# Patient Record
Sex: Female | Born: 1950 | Race: White | Hispanic: No | Marital: Married | Smoking: Never smoker
Health system: Southern US, Community
[De-identification: ages and names within clinical notes are randomized; demographics above are authoritative.]

---

## 1998-06-10 ENCOUNTER — Inpatient Hospital Stay (HOSPITAL_COMMUNITY): Admission: RE | Admit: 1998-06-10 | Discharge: 1998-06-12 | Payer: Self-pay | Admitting: Obstetrics and Gynecology

## 1999-09-12 ENCOUNTER — Other Ambulatory Visit: Admission: RE | Admit: 1999-09-12 | Discharge: 1999-09-12 | Payer: Self-pay | Admitting: Obstetrics and Gynecology

## 2000-08-06 ENCOUNTER — Encounter: Payer: Self-pay | Admitting: Obstetrics and Gynecology

## 2000-08-06 ENCOUNTER — Encounter: Admission: RE | Admit: 2000-08-06 | Discharge: 2000-08-06 | Payer: Self-pay | Admitting: Obstetrics and Gynecology

## 2000-10-15 ENCOUNTER — Other Ambulatory Visit: Admission: RE | Admit: 2000-10-15 | Discharge: 2000-10-15 | Payer: Self-pay | Admitting: Obstetrics and Gynecology

## 2001-02-18 ENCOUNTER — Ambulatory Visit (HOSPITAL_COMMUNITY): Admission: RE | Admit: 2001-02-18 | Discharge: 2001-02-18 | Payer: Self-pay | Admitting: Gastroenterology

## 2003-01-01 ENCOUNTER — Other Ambulatory Visit: Admission: RE | Admit: 2003-01-01 | Discharge: 2003-01-01 | Payer: Self-pay | Admitting: Obstetrics and Gynecology

## 2003-03-29 ENCOUNTER — Encounter: Admission: RE | Admit: 2003-03-29 | Discharge: 2003-06-27 | Payer: Self-pay | Admitting: Obstetrics and Gynecology

## 2003-07-02 ENCOUNTER — Encounter: Admission: RE | Admit: 2003-07-02 | Discharge: 2003-09-30 | Payer: Self-pay | Admitting: Obstetrics and Gynecology

## 2015-01-04 ENCOUNTER — Other Ambulatory Visit: Payer: Self-pay | Admitting: Family Medicine

## 2015-01-04 DIAGNOSIS — R410 Disorientation, unspecified: Secondary | ICD-10-CM

## 2015-01-09 ENCOUNTER — Ambulatory Visit
Admission: RE | Admit: 2015-01-09 | Discharge: 2015-01-09 | Disposition: A | Discharge status: Home / Self Care | Payer: BC Managed Care – PPO | Source: Ambulatory Visit | Attending: Family Medicine | Admitting: Family Medicine

## 2015-01-09 DIAGNOSIS — R410 Disorientation, unspecified: Secondary | ICD-10-CM

## 2015-08-06 ENCOUNTER — Other Ambulatory Visit: Payer: Self-pay | Admitting: Obstetrics and Gynecology

## 2015-08-06 DIAGNOSIS — R928 Other abnormal and inconclusive findings on diagnostic imaging of breast: Secondary | ICD-10-CM

## 2015-08-27 ENCOUNTER — Ambulatory Visit
Admission: RE | Admit: 2015-08-27 | Discharge: 2015-08-27 | Disposition: A | Discharge status: Home / Self Care | Payer: BC Managed Care – PPO | Source: Ambulatory Visit | Attending: Obstetrics and Gynecology | Admitting: Obstetrics and Gynecology

## 2015-08-27 DIAGNOSIS — R928 Other abnormal and inconclusive findings on diagnostic imaging of breast: Secondary | ICD-10-CM

## 2020-01-23 ENCOUNTER — Ambulatory Visit: Payer: Medicare PPO | Admitting: Dermatology

## 2020-01-23 ENCOUNTER — Other Ambulatory Visit: Payer: Self-pay

## 2020-01-23 ENCOUNTER — Encounter: Payer: Self-pay | Admitting: Dermatology

## 2020-01-23 DIAGNOSIS — L309 Dermatitis, unspecified: Secondary | ICD-10-CM | POA: Diagnosis not present

## 2020-01-23 DIAGNOSIS — L821 Other seborrheic keratosis: Secondary | ICD-10-CM

## 2020-01-23 DIAGNOSIS — L918 Other hypertrophic disorders of the skin: Secondary | ICD-10-CM

## 2020-01-23 MED ORDER — BETAMETHASONE DIPROPIONATE AUG 0.05 % EX CREA
TOPICAL_CREAM | CUTANEOUS | 0 refills | Status: AC
Start: 2020-01-23 — End: ?

## 2020-01-23 NOTE — Patient Instructions (Signed)
Routine follow-up and final visit for [facility] who is moving to [place] next week.  There are 2 types of benign nonmobile lesions include rough textured brown keratoses and on rub areas elevated skin tags.  With an 11 Hour Dr. coming up next week, we mutually chose to do no procedures today the back and sun exposed skin showed no current skin cancer or atypical moles.  On her arms are pink-tan circular patches which could represent either nummular eczema and or some chronic UV damage.  She will initially apply generic augmented betamethasone diprionatee prescription cream daily after bathing for 1 month.  If this produces little to no improvement, she will find a dermatologist in her new home land and may be prescribed topical fluorouracil to treat this as sun damage.  Shannon Cannon knows she can call me at anytime from any country with any questions.

## 2020-01-26 ENCOUNTER — Encounter: Payer: Self-pay | Admitting: Dermatology

## 2020-01-26 NOTE — Progress Notes (Signed)
   New Patient (>3years since seen) visit   Subjective  Shannon Cannon is a 69 y.o. female who presents for the following: New Patient (Initial Visit) (Patient here today for skin changes on her arms and in her groin crease x few months.  Patient states that the spots on her arms are dark and scaly, no bleeding.  Patient sates that spots inher groin crease are just dark and she does have skin tags.  Patient wants to know what can be done for these spots.  Patient moving to [place] next week.).  rash Location: arms  Duration:  Quality:  Associated Signs/Symptoms: Modifying Factors:  Severity:  Timing: Context:   The following portions of the chart were reviewed this encounter and updated as appropriate:     Objective  Well appearing patient in no apparent distress; mood and affect are within normal limits.  All sun exposed areas plus back examined. Plus upper legs, leg creases, back.   Assessment & Plan  Eczema, unspecified type  Ordered Medications: augmented betamethasone dipropionate (DIPROLENE-AF) 0.05 % cream  Routine follow-up and final visit for [facility] who is moving to [place] next week.  There are 2 types of benign nonmobile lesions include rough textured brown keratoses and on rub areas elevated skin tags.  With an eleven hour drive coming up next week, we mutually chose to do no procedures today. the back and sun exposed skin showed no current skin cancer or atypical moles.  On her arms are pink-tan circular patches which could represent either nummular eczema and or some chronic UV damage.  She will initially apply generic augmented betamethasone diprionatee prescription cream daily after bathing for 1 month.  If this produces little to no improvement, she will find a dermatologist in [place] new [place] and may be prescribed topical fluorouracil to treat this as sun damage.  Lilliana knows she can call me at anytime from any country with any questions.
